# Patient Record
Sex: Female | Born: 1995
Health system: Southern US, Community
[De-identification: ages and names within clinical notes are randomized; demographics above are authoritative.]

## PROBLEM LIST (undated history)

## (undated) DIAGNOSIS — K589 Irritable bowel syndrome without diarrhea: Secondary | ICD-10-CM

## (undated) DIAGNOSIS — D649 Anemia, unspecified: Secondary | ICD-10-CM

## (undated) DIAGNOSIS — J45909 Unspecified asthma, uncomplicated: Secondary | ICD-10-CM

---

## 2019-11-18 ENCOUNTER — Encounter (HOSPITAL_COMMUNITY): Payer: Self-pay | Admitting: Emergency Medicine

## 2019-11-18 ENCOUNTER — Emergency Department (HOSPITAL_COMMUNITY)
Admission: EM | Admit: 2019-11-18 | Discharge: 2019-11-18 | Disposition: A | Discharge status: Home / Self Care | Payer: Self-pay | Attending: Emergency Medicine | Admitting: Emergency Medicine

## 2019-11-18 ENCOUNTER — Emergency Department (HOSPITAL_COMMUNITY): Payer: Self-pay

## 2019-11-18 ENCOUNTER — Other Ambulatory Visit: Payer: Self-pay

## 2019-11-18 DIAGNOSIS — M79641 Pain in right hand: Secondary | ICD-10-CM | POA: Diagnosis not present

## 2019-11-18 DIAGNOSIS — M25531 Pain in right wrist: Secondary | ICD-10-CM | POA: Insufficient documentation

## 2019-11-18 DIAGNOSIS — Z5321 Procedure and treatment not carried out due to patient leaving prior to being seen by health care provider: Secondary | ICD-10-CM | POA: Diagnosis not present

## 2019-11-18 DIAGNOSIS — M542 Cervicalgia: Secondary | ICD-10-CM | POA: Diagnosis present

## 2019-11-18 HISTORY — DX: Unspecified asthma, uncomplicated: J45.909

## 2019-11-18 HISTORY — DX: Anemia, unspecified: D64.9
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
## 2019-11-18 NOTE — ED Notes (Signed)
Pt called x3 in the waiting room. No reponse. Per another pt in the waiting room, pt has left.

## 2019-12-13 MED FILL — MELOXICAM 15 MG TABLET: 15 | 30 days supply | Qty: 30 | Fill #0

## 2020-08-07 ENCOUNTER — Other Ambulatory Visit: Payer: Self-pay

## 2020-08-07 ENCOUNTER — Emergency Department (HOSPITAL_COMMUNITY)
Admission: EM | Admit: 2020-08-07 | Discharge: 2020-08-07 | Disposition: A | Discharge status: Home / Self Care | Payer: No Typology Code available for payment source | Attending: Emergency Medicine | Admitting: Emergency Medicine

## 2020-08-07 ENCOUNTER — Emergency Department (HOSPITAL_COMMUNITY): Payer: No Typology Code available for payment source

## 2020-08-07 ENCOUNTER — Encounter (HOSPITAL_COMMUNITY): Payer: Self-pay | Admitting: Emergency Medicine

## 2020-08-07 DIAGNOSIS — S8251XA Displaced fracture of medial malleolus of right tibia, initial encounter for closed fracture: Secondary | ICD-10-CM | POA: Insufficient documentation

## 2020-08-07 DIAGNOSIS — Y9301 Activity, walking, marching and hiking: Secondary | ICD-10-CM | POA: Insufficient documentation

## 2020-08-07 DIAGNOSIS — J45909 Unspecified asthma, uncomplicated: Secondary | ICD-10-CM | POA: Insufficient documentation

## 2020-08-07 DIAGNOSIS — S99911A Unspecified injury of right ankle, initial encounter: Secondary | ICD-10-CM | POA: Diagnosis present

## 2020-08-07 DIAGNOSIS — W010XXA Fall on same level from slipping, tripping and stumbling without subsequent striking against object, initial encounter: Secondary | ICD-10-CM | POA: Diagnosis not present

## 2020-08-07 DIAGNOSIS — S82891A Other fracture of right lower leg, initial encounter for closed fracture: Secondary | ICD-10-CM

## 2020-08-07 MED ORDER — HYDROCODONE-ACETAMINOPHEN 5-325 MG PO TABS: 1.0000 | ORAL_TABLET | ORAL | 0 refills | Status: AC | PRN

## 2020-08-07 MED ORDER — OXYCODONE-ACETAMINOPHEN 5-325 MG PO TABS
1.0000 | ORAL_TABLET | ORAL | Status: DC | PRN
  Administered 2020-08-07: 1 via ORAL
  Filled 2020-08-07: qty 1

## 2020-08-07 NOTE — ED Triage Notes (Signed)
Patient here for evaluation of right lower leg fracture. [facility] and was told she has a fibula fracture. Patient took 200mg  ibuprofen at 10am this morning. Patient alert and oriented at this time.

## 2020-08-07 NOTE — Discharge Instructions (Signed)
Use the cam walker, whenever you get up to walk.  Try to elevate your right ankle above your heart, as much as possible.  Use ice to the sore area of your right ankle, 3 or 4 times a day for 30 minutes.  Call the on-call orthopedic doctor, or the one you were referred to earlier today, for a follow-up appointment and 3 or 4 days.

## 2020-08-07 NOTE — ED Provider Notes (Signed)
[facility] EMERGENCY DEPARTMENT Provider Note   CSN: [id number] Arrival date & time: 08/07/20  1535     History Chief Complaint  Patient presents with  . Fall    Tracy Lee is a 24 y.o. female.  HPI Patient injured her right ankle today while walking and tripped.  This is described as a mechanical fall.  She injured only her right ankle.  She was seen at student health at [facility] [employer].  She was treated with a ASO brace and referred to orthopedics.  She has not been able to see or contact orthopedics yet.  She was told that she had only a fibula fracture.  No prior injuries to the right leg or ankle.  There are no other known modifying factors.    Past Medical History:  Diagnosis Date  . Anemia   . Asthma     There are no problems to display for this patient.   History reviewed. No pertinent surgical history.   OB History   No obstetric history on file.     No family history on file.  Social History   Tobacco Use  . Smoking status: Not on file  Substance Use Topics  . Alcohol use: Not on file  . Drug use: Not on file    Home Medications Prior to Admission medications   Medication Sig Start Date End Date Taking? Authorizing Provider  HYDROcodone-acetaminophen (NORCO) 5-325 MG tablet Take 1 tablet by mouth every 4 (four) hours as needed. 08/07/20   Mancel Bale, MD    Allergies    Patient has no known allergies.  Review of Systems   Review of Systems  All other systems reviewed and are negative.   Physical Exam Updated Vital Signs BP 119/75 (BP Location: Right Arm)   Pulse 80   Temp 99 F (37.2 C)   Resp 18   Ht 5\' 4"  (1.626 m)   Wt 85.3 kg   LMP 08/01/2020 (Exact Date)   SpO2 100%   BMI 32.27 kg/m   Physical Exam Vitals and nursing note reviewed.  Constitutional:      General: She is not in acute distress.    Appearance: She is well-developed. She is not ill-appearing, toxic-appearing or diaphoretic.  HENT:      Head: Normocephalic and atraumatic.     Right Ear: External ear normal.     Left Ear: External ear normal.  Eyes:     Conjunctiva/sclera: Conjunctivae normal.     Pupils: Pupils are equal, round, and reactive to light.  Neck:     Trachea: Phonation normal.  Cardiovascular:     Rate and Rhythm: Normal rate.  Pulmonary:     Effort: Pulmonary effort is normal.  Abdominal:     General: There is no distension.  Musculoskeletal:     Cervical back: Normal range of motion and neck supple.     Comments: Right ankle tender and swollen, right ankle stable to stress.  No proximal tenderness or deformity of the right leg.  Neurovascular intact distally in the toes of the right foot.  Skin:    General: Skin is warm and dry.  Neurological:     Mental Status: She is alert and oriented to person, place, and time.     Cranial Nerves: No cranial nerve deficit.     Sensory: No sensory deficit.     Motor: No abnormal muscle tone.     Coordination: Coordination normal.  Psychiatric:  Mood and Affect: Mood normal.        Behavior: Behavior normal.        Thought Content: Thought content normal.        Judgment: Judgment normal.     ED Results / Procedures / Treatments   Labs (all labs ordered are listed, but only abnormal results are displayed) Labs Reviewed - No data to display  EKG None  Radiology DG Tibia/Fibula Right  Result Date: 08/07/2020 CLINICAL DATA:  Fall with swelling and pain EXAM: RIGHT TIBIA AND FIBULA - 2 VIEW COMPARISON:  None. FINDINGS: There is a nondisplaced fracture seen through the medial malleolus and distal fibular tip. The ankle mortise however appears to be grossly congruent. There is diffuse soft tissue swelling seen around ankle. IMPRESSION: Nondisplaced fracture seen through the medial malleolus and distal fibular tip. Electronically Signed   By: Jonna Clark M.D.   On: 08/07/2020 16:55   DG Ankle Complete Right  Result Date: 08/07/2020 CLINICAL DATA:   Fall with swelling and pain EXAM: RIGHT ANKLE - COMPLETE 3+ VIEW COMPARISON:  None. FINDINGS: There is a minimally displaced medial malleolar and distal fibular tip fracture. There is minimal widening of the medial clear space measuring 4 mm. Diffuse overlying soft tissue swelling is seen. There is a small ankle joint effusion. IMPRESSION: Minimally displaced medial malleolar and distal fibular tip fracture. Electronically Signed   By: Jonna Clark M.D.   On: 08/07/2020 17:00    Procedures Procedures (including critical care time)  Medications Ordered in ED Medications  oxyCODONE-acetaminophen (PERCOCET/ROXICET) 5-325 MG per tablet 1 tablet (1 tablet Oral Given 08/07/20 1601)    ED Course  I have reviewed the triage vital signs and the nursing notes.  Pertinent labs & imaging results that were available during my care of the patient were reviewed by me and considered in my medical decision making (see chart for details).    MDM Rules/Calculators/A&P                           Patient Vitals for the past 24 hrs:  BP Temp Temp src Pulse Resp SpO2 Height Weight  08/07/20 2226 119/75 99 F (37.2 C) -- 80 18 100 % -- --  08/07/20 2007 124/80 -- -- 79 16 100 % -- --  08/07/20 1545 (!) 132/100 99 F (37.2 C) Oral 97 18 100 % 5\' 4"  (1.626 m) 85.3 kg    11:18 PM Reevaluation with update and discussion. After initial assessment and treatment, an updated evaluation reveals at this time no further complaints.  Findings discussed with the patient all questions were answered.   Medical Decision Making:  This patient is presenting for evaluation of isolated injury to right ankle, which does not require a range of treatment options, and is not a complaint that involves a high risk of morbidity and mortality. The differential diagnoses include sprain, fracture. I decided to review old records, and in summary healthy college student, tripped and injured her right ankle today.  I did not  require additional historical information from anyone.   Radiologic Tests Ordered, included right tib-fib, right ankle.  I independently Visualized: Radiographic images, which show nondisplaced medial and lateral malleoli fractures.    Critical Interventions-clinical evaluation, radiography, observation reassessment  After These Interventions, the Patient was reevaluated and was found stable for discharge.  Stable right ankle fractures, not requiring immediate operative intervention.  Patient treated with cam walker and  discharged with pain medication.  Referred to orthopedics for follow-up care.  She can be semiweightbearing.  She currently has crutches.  CRITICAL CARE-no Performed by: Mancel Bale  Nursing Notes Reviewed/ Care Coordinated Applicable Imaging Reviewed Interpretation of Laboratory Data incorporated into ED treatment  The patient appears reasonably screened and/or stabilized for discharge and I doubt any other medical condition or other [facility] requiring further screening, evaluation, or treatment in the ED at this time prior to discharge.  Plan: Home Medications-OTC analgesia of choice; Home Treatments-CAM Walker for comfort when walking, semiweightbearing; return here if the recommended treatment, does not improve the symptoms; Recommended follow up-orthopedic follow-up as soon as possible.     Final Clinical Impression(s) / ED Diagnoses Final diagnoses:  Closed fracture of right ankle, initial encounter    Rx / DC Orders ED Discharge Orders         Ordered    HYDROcodone-acetaminophen (NORCO) 5-325 MG tablet  Every 4 hours PRN        08/07/20 2246           Mancel Bale, MD 08/07/20 2318

## 2020-08-07 NOTE — Progress Notes (Signed)
Orthopedic Tech Progress Note Patient Details:  Tracy Lee 30-Oct-1995 [id number]  Ortho Devices Type of Ortho Device: CAM walker Ortho Device/Splint Location: rle Ortho Device/Splint Interventions: Ordered, Application, Adjustment   Post Interventions Patient Tolerated: Well Instructions Provided: Care of device, Adjustment of device   [name] 08/07/2020, 11:17 PM

## 2021-02-03 ENCOUNTER — Encounter (HOSPITAL_COMMUNITY): Payer: Self-pay

## 2021-02-03 ENCOUNTER — Emergency Department (HOSPITAL_COMMUNITY)
Admission: EM | Admit: 2021-02-03 | Discharge: 2021-02-03 | Disposition: A | Discharge status: Home / Self Care | Payer: Medicaid - Out of State | Attending: Emergency Medicine | Admitting: Emergency Medicine

## 2021-02-03 ENCOUNTER — Emergency Department (HOSPITAL_COMMUNITY): Payer: Medicaid - Out of State

## 2021-02-03 ENCOUNTER — Other Ambulatory Visit: Payer: Self-pay

## 2021-02-03 DIAGNOSIS — R1084 Generalized abdominal pain: Secondary | ICD-10-CM

## 2021-02-03 DIAGNOSIS — R112 Nausea with vomiting, unspecified: Secondary | ICD-10-CM

## 2021-02-03 DIAGNOSIS — R197 Diarrhea, unspecified: Secondary | ICD-10-CM | POA: Insufficient documentation

## 2021-02-03 HISTORY — DX: Irritable bowel syndrome without diarrhea: K58.9

## 2021-02-03 LAB — CBC WITH DIFFERENTIAL/PLATELET
Abs Immature Granulocytes: 0.04 10*3/uL (ref 0.00–0.07)
Basophils Absolute: 0 10*3/uL (ref 0.0–0.1)
Basophils Relative: 0 %
Eosinophils Absolute: 0 10*3/uL (ref 0.0–0.5)
Eosinophils Relative: 0 %
HCT: 32.3 % — ABNORMAL LOW (ref 36.0–46.0)
Hemoglobin: 9.6 g/dL — ABNORMAL LOW (ref 12.0–15.0)
Immature Granulocytes: 0 %
Lymphocytes Relative: 3 %
Lymphs Abs: 0.3 10*3/uL — ABNORMAL LOW (ref 0.7–4.0)
MCH: 20.6 pg — ABNORMAL LOW (ref 26.0–34.0)
MCHC: 29.7 g/dL — ABNORMAL LOW (ref 30.0–36.0)
MCV: 69.3 fL — ABNORMAL LOW (ref 80.0–100.0)
Monocytes Absolute: 0.5 10*3/uL (ref 0.1–1.0)
Monocytes Relative: 5 %
Neutro Abs: 8.7 10*3/uL — ABNORMAL HIGH (ref 1.7–7.7)
Neutrophils Relative %: 92 %
Platelets: 534 10*3/uL — ABNORMAL HIGH (ref 150–400)
RBC: 4.66 MIL/uL (ref 3.87–5.11)
RDW: 21.5 % — ABNORMAL HIGH (ref 11.5–15.5)
WBC: 9.6 10*3/uL (ref 4.0–10.5)
nRBC: 0 % (ref 0.0–0.2)

## 2021-02-03 LAB — COMPREHENSIVE METABOLIC PANEL
ALT: 16 U/L (ref 0–44)
AST: 26 U/L (ref 15–41)
Albumin: 4.8 g/dL (ref 3.5–5.0)
Alkaline Phosphatase: 37 U/L — ABNORMAL LOW (ref 38–126)
Anion gap: 12 (ref 5–15)
BUN: 16 mg/dL (ref 6–20)
CO2: 18 mmol/L — ABNORMAL LOW (ref 22–32)
Calcium: 9.8 mg/dL (ref 8.9–10.3)
Chloride: 109 mmol/L (ref 98–111)
Creatinine, Ser: 0.54 mg/dL (ref 0.44–1.00)
GFR, Estimated: >60 mL/min (ref >60)
Glucose, Bld: 139 mg/dL — ABNORMAL HIGH (ref 70–99)
Potassium: 3.5 mmol/L (ref 3.5–5.1)
Sodium: 139 mmol/L (ref 135–145)
Total Bilirubin: 1.1 mg/dL (ref 0.3–1.2)
Total Protein: 8.5 g/dL — ABNORMAL HIGH (ref 6.5–8.1)

## 2021-02-03 LAB — URINALYSIS, ROUTINE W REFLEX MICROSCOPIC
Bacteria, UA: NONE SEEN
Bilirubin Urine: NEGATIVE
Glucose, UA: NEGATIVE mg/dL
Ketones, ur: 80 mg/dL — AB
Nitrite: NEGATIVE
Protein, ur: NEGATIVE mg/dL
RBC / HPF: >50 RBC/hpf — ABNORMAL HIGH (ref 0–5)
Specific Gravity, Urine: >1.046 — ABNORMAL HIGH (ref 1.005–1.030)
pH: 5 (ref 5.0–8.0)

## 2021-02-03 LAB — I-STAT BETA HCG BLOOD, ED (MC, WL, AP ONLY): I-stat hCG, quantitative: <5 m[IU]/mL (ref <5)

## 2021-02-03 LAB — LIPASE, BLOOD: Lipase: 26 U/L (ref 11–51)

## 2021-02-03 MED ORDER — HYDROGEN PEROXIDE 3 % EX SOLN
CUTANEOUS | Status: AC
  Filled 2021-02-03: qty 473

## 2021-02-03 MED ORDER — IOHEXOL 300 MG/ML  SOLN
100.0000 mL | Freq: Once | INTRAMUSCULAR | Status: AC | PRN
  Administered 2021-02-03: 100 mL via INTRAVENOUS

## 2021-02-03 MED ORDER — ONDANSETRON 4 MG PO TBDP: 4.0000 mg | ORAL_TABLET | Freq: Three times a day (TID) | ORAL | 0 refills | Status: AC | PRN

## 2021-02-03 MED ORDER — MORPHINE SULFATE (PF) 4 MG/ML IV SOLN
4.0000 mg | Freq: Once | INTRAVENOUS | Status: AC
Start: 2021-02-03 — End: 2021-02-03
  Administered 2021-02-03: 4 mg via INTRAVENOUS
  Filled 2021-02-03: qty 1

## 2021-02-03 MED ORDER — ONDANSETRON HCL 4 MG/2ML IJ SOLN
4.0000 mg | Freq: Once | INTRAMUSCULAR | Status: AC
  Administered 2021-02-03: 4 mg via INTRAVENOUS
  Filled 2021-02-03: qty 2

## 2021-02-03 MED ORDER — MORPHINE SULFATE (PF) 4 MG/ML IV SOLN
4.0000 mg | Freq: Once | INTRAVENOUS | Status: AC
  Administered 2021-02-03: 4 mg via INTRAVENOUS
  Filled 2021-02-03: qty 1

## 2021-02-03 MED ORDER — DICYCLOMINE HCL 20 MG PO TABS: 20.0000 mg | ORAL_TABLET | Freq: Two times a day (BID) | ORAL | 0 refills | Status: AC

## 2021-02-03 MED ORDER — SODIUM CHLORIDE 0.9 % IV BOLUS
1000.0000 mL | Freq: Once | INTRAVENOUS | Status: AC
  Administered 2021-02-03: 1000 mL via INTRAVENOUS

## 2021-02-03 NOTE — ED Notes (Signed)
Pt ate and drank without n/v

## 2021-02-03 NOTE — ED Provider Notes (Signed)
[facility]-EMERGENCY DEPT Provider Note   CSN: [id number] Arrival date & time: 02/03/21  1302     History Chief Complaint  Patient presents with  . Abdominal Pain  . Emesis    Tracy Lee is a 25 y.o. female with history significant for IBS who presents for evaluation nausea, vomiting and diarrhea.  Symptoms began yesterday.  States she has had NBNB emesis every hour.  Denies any melena or bright blood per rectum.  No recent antibiotics or travel.  No sick contacts.  She attempted Pepto-Bismol however had emesis immediately after this.  She denies any prior abdominal surgeries.  Denies any marijuana or illicit substance use.  She denies fever, chills, chest pain, shortness of breath, dysuria, hematuria, paresthesias or weakness.  Denies additional aggravating or alleviating factors.  She denies any alcohol use or chronic NSAID use. Rates pain a 10/10. States pain is "all over." No lower abd pain,pelvic pain. No concern for STD.  History obtained from patient and past medical records.  No interpreter is used  HPI     Past Medical History:  Diagnosis Date  . IBS (irritable bowel syndrome)     There are no problems to display for this patient.   History reviewed. No pertinent surgical history.   OB History   No obstetric history on file.     No family history on file.  Social History   Tobacco Use  . Smoking status: Never Smoker  . Smokeless tobacco: Never Used  Substance Use Topics  . Alcohol use: Not Currently  . Drug use: Not Currently    Home Medications Prior to Admission medications   Medication Sig Start Date End Date Taking? Authorizing Provider  dicyclomine (BENTYL) 20 MG tablet Take 1 tablet (20 mg total) by mouth 2 (two) times daily. 02/03/21  Yes Nakisha Chai A, PA-C  ondansetron (ZOFRAN ODT) 4 MG disintegrating tablet Take 1 tablet (4 mg total) by mouth every 8 (eight) hours as needed for nausea or vomiting. 02/03/21  Yes  Ayahna Solazzo A, PA-C    Allergies    Patient has no known allergies.  Review of Systems   Review of Systems  Constitutional: Negative.   HENT: Negative.   Respiratory: Negative.   Cardiovascular: Negative.   Gastrointestinal: Positive for abdominal pain, diarrhea, nausea and vomiting. Negative for abdominal distention, anal bleeding, blood in stool, constipation and rectal pain.  Genitourinary: Negative.   Musculoskeletal: Negative.   Skin: Negative.   Neurological: Negative.   All other systems reviewed and are negative.   Physical Exam Updated Vital Signs BP (!) 137/91 (BP Location: Left Arm)   Pulse 87   Temp 97.6 F (36.4 C) (Oral)   Resp 18   Ht 5\' 5"  (1.651 m)   Wt 80.7 kg   LMP 02/01/2021   SpO2 99%   BMI 29.62 kg/m   Physical Exam Vitals and nursing note reviewed.  Constitutional:      General: She is not in acute distress.    Appearance: She is well-developed. She is not ill-appearing, toxic-appearing or diaphoretic.     Comments: Tossing and turning in bed.  Actively dry heaving.  HENT:     Head: Normocephalic and atraumatic.     Mouth/Throat:     Mouth: Mucous membranes are moist.  Eyes:     Pupils: Pupils are equal, round, and reactive to light.  Cardiovascular:     Rate and Rhythm: Normal rate.     Heart sounds:  Normal heart sounds.  Pulmonary:     Effort: Pulmonary effort is normal. No respiratory distress.     Breath sounds: Normal breath sounds.  Abdominal:     General: Bowel sounds are normal. There is no distension.     Palpations: Abdomen is soft.     Tenderness: There is generalized abdominal tenderness.     Comments: Soft, diffuse tenderness.  Musculoskeletal:        General: Normal range of motion.     Cervical back: Normal range of motion.  Skin:    General: Skin is warm and dry.     Capillary Refill: Capillary refill takes less than 2 seconds.  Neurological:     General: No focal deficit present.     Mental Status: She is  alert and oriented to person, place, and time.     ED Results / Procedures / Treatments   Labs (all labs ordered are listed, but only abnormal results are displayed) Labs Reviewed  CBC WITH DIFFERENTIAL/PLATELET - Abnormal; Notable for the following components:      Result Value   Hemoglobin 9.6 (*)    HCT 32.3 (*)    MCV 69.3 (*)    MCH 20.6 (*)    MCHC 29.7 (*)    RDW 21.5 (*)    Platelets 534 (*)    Neutro Abs 8.7 (*)    Lymphs Abs 0.3 (*)    All other components within normal limits  COMPREHENSIVE METABOLIC PANEL - Abnormal; Notable for the following components:   CO2 18 (*)    Glucose, Bld 139 (*)    Total Protein 8.5 (*)    Alkaline Phosphatase 37 (*)    All other components within normal limits  LIPASE, BLOOD  URINALYSIS, ROUTINE W REFLEX MICROSCOPIC  I-STAT BETA HCG BLOOD, ED ([facility], [facility], [facility] ONLY)    EKG None  Radiology CT Abdomen Pelvis W Contrast  Result Date: 02/03/2021 CLINICAL DATA:  Generalized abdominal pain with nausea vomiting and diarrhea x1 day EXAM: CT ABDOMEN AND PELVIS WITH CONTRAST TECHNIQUE: Multidetector CT imaging of the abdomen and pelvis was performed using the standard protocol following bolus administration of intravenous contrast. CONTRAST:  OMNIPAQUE IOHEXOL 300 MG/ML  SOLN COMPARISON:  None. FINDINGS: Lower chest: No acute abnormality. Normal size heart. No significant pericardial effusion/thickening. Hepatobiliary: No suspicious hepatic lesion. Gallbladder is grossly unremarkable. No biliary ductal dilation. Pancreas: Within normal limits. Spleen: Within normal limits. Adrenals/Urinary Tract: Adrenal glands are unremarkable. Kidneys are normal, without renal calculi, focal lesion, or hydronephrosis. Bladder is unremarkable. Stomach/Bowel: Stomach is grossly unremarkable. No pathologic dilation of small bowel. Normal appendix. The colon is predominately decompressed limiting evaluation. There is very mild stranding adjacent to a short segment of  transverse colon at the hepatic flexure. Vascular/Lymphatic: No significant vascular findings are present. No enlarged abdominal or pelvic lymph nodes. Reproductive: Intrauterine device which appears to extend through the main component extending through the cervical os and the arms projecting out into the lower uterine segment. No evidence of uterine perforation. Right adnexa is unremarkable. 3.3 cm left ovarian cyst. No follow-up imaging recommended. Note: This recommendation does not apply to premenarchal patients and to those with increased risk (genetic, family history, elevated tumor markers or other high-risk factors) of ovarian cancer. Reference: JACR 2020 Feb; 17(2):248-254 Other: No abdominopelvic ascites.  No pneumoperitoneum. Musculoskeletal: No acute osseous abnormality IMPRESSION: 1. The colon is predominately decompressed with very mild fat stranding adjacent to a short segment of transverse colon at  the hepatic flexure, which may reflect mild colitis. 2. Intrauterine device appears to extend through the cervical os with the arms projecting out into the lower uterine segment. Consistent with malpositioning of the IUD without evidence of uterine perforation. Electronically Signed   By: Maudry Mayhew MD   On: 02/03/2021 16:01   Procedures Procedures   Medications Ordered in ED Medications  sodium chloride 0.9 % bolus 1,000 mL (1,000 mLs Intravenous New Bag/Given 02/03/21 1400)  ondansetron (ZOFRAN) injection 4 mg (4 mg Intravenous Given 02/03/21 1355)  morphine 4 MG/ML injection 4 mg (4 mg Intravenous Given 02/03/21 1356)  iohexol (OMNIPAQUE) 300 MG/ML solution 100 mL (100 mLs Intravenous Contrast Given 02/03/21 1502)   ED Course  I have reviewed the triage vital signs and the nursing notes.  Pertinent labs & imaging results that were available during my care of the patient were reviewed by me and considered in my medical decision making (see chart for details).  25 year old here for  evaluation nausea, vomiting and diarrhea.  Began yesterday. She is afebrile, nonseptic, non-ill-appearing.  Multiple sets of NBNB emesis.  Diarrhea without any melena.  Per rectum.  No recent sick contacts, antibiotics or travel.  On arrival patient is tossing and turning in bed, actively dry heaving.  She denies any marijuana use, illicit substance use.  Heart lungs are clear.  Abdomen is soft however diffusely tender.  She denies any concerns for any pelvic pain, vaginal discharge or STDs.  Plan on labs, imaging and reassess  Labs and imaging personally reviewed and interpreted:  Pregnancy test negative CBC without leukocytosis, hemoglobin 9.6, no prior to compare.  She denies any heavy menstrual cycles or active bleeding CMP CO2 18, glucose 139 Lipase 26 UA pending CT AP likly mild colitis, IUD malposition however no evidence of uterine perf  Patient reassessed. Pain improved. Emesis improved.  Discussed labs and imaging findings.  She would like to attempt p.o. challenge.  Discussed still need to evaluate.  She is agreeable.  She appears significantly more comfortable than her prior evaluations.  On repeat exam patient does not have a surgical abdomin and there are no peritoneal signs.  No indication of appendicitis, bowel obstruction, bowel perforation, cholecystitis, diverticulitis, PID, torsion or ectopic pregnancy.  Patient discharged home with symptomatic treatment and given strict instructions for follow-up with their primary care physician.  I have also discussed reasons to return immediately to the ER.  Patient expresses understanding and agrees with plan.    Patient reassessed. Emesis improved. Tolerated PO challenge.   Care transferred to [facility], [place] who will follow up on UA. Anticipate likely dc home if no continued emesis.      MDM Rules/Calculators/A&P                          Final Clinical Impression(s) / ED Diagnoses Final diagnoses:  Nausea vomiting and diarrhea   Generalized abdominal pain    Rx / DC Orders ED Discharge Orders         Ordered    ondansetron (ZOFRAN ODT) 4 MG disintegrating tablet  Every 8 hours PRN        02/03/21 1812    dicyclomine (BENTYL) 20 MG tablet  2 times daily        02/03/21 1812           Kadasia Kassing A, PA-C 02/03/21 1836    Milagros Loll, MD 02/04/21 [phone]

## 2021-02-03 NOTE — ED Notes (Signed)
Pt aware urine sample is needed 

## 2021-02-03 NOTE — ED Notes (Signed)
Patient is throwing up.

## 2021-02-03 NOTE — ED Notes (Signed)
Pt vomiting again, provider made aware

## 2021-02-03 NOTE — ED Notes (Signed)
Gave pt snack and drink, will reassess shortly

## 2021-02-03 NOTE — ED Notes (Signed)
Patient is drinking gingerale and not vomiting or not feeling nauseas.

## 2021-02-03 NOTE — ED Provider Notes (Signed)
Care assumed at shift change from [facility], PA-C, pending UA and discharge. See their note for full HPI and workup. Briefly, pt presenting with N/V/D abdominal pain. CT AP shows mild signs of colitis. No pelvic complaints.  Physical Exam  BP 105/87   Pulse 86   Temp 97.6 F (36.4 C) (Oral)   Resp 20   Ht 5\' 5"  (1.651 m)   Wt 80.7 kg   LMP 02/01/2021   SpO2 100%   BMI 29.62 kg/m   Physical Exam Vitals and nursing note reviewed.  Constitutional:      Appearance: She is well-developed.  HENT:     Head: Normocephalic and atraumatic.  Eyes:     Conjunctiva/sclera: Conjunctivae normal.  Cardiovascular:     Rate and Rhythm: Normal rate.  Pulmonary:     Effort: Pulmonary effort is normal.  Abdominal:     Comments: vomiting  Neurological:     Mental Status: She is alert.  Psychiatric:        Mood and Affect: Mood normal.        Behavior: Behavior normal.     ED Course/Procedures     Procedures  MDM  U/A is not consistent with UTI, patient also asymptomatic.  Discussed incidental finding on CT of  IUD placement with patient and need for GYN follow-up.  She verbalized understanding of this.  She has vomiting on reevaluation, given an additional dose of antiemetic and patient states she is ready for discharge to home.  Discharged with symptomatic management.      Kavin Weckwerth, 04/03/2021 N, PA-C 02/04/21 1606    04/06/21, MD 02/05/21 2007

## 2021-02-03 NOTE — Discharge Instructions (Addendum)
Your work-up here in the emergency department is reassuring  Your CT scan did show colitis which is likely a viral illness.  You may take Imodium at home  Drink clear liquids until your stomach feels better.  Then you can slowly advance your diet as tolerated with bland foods.  I have also written you for a medication called Bentyl to help with abdominal spasms as well as Zofran which is a disintegrating tablet you may take for any vomiting.  Please follow-up with your gynecologist regarding your IUD placement.  Return for any worsening symptoms

## 2021-02-03 NOTE — ED Notes (Signed)
Brought pt emesis bag

## 2021-02-03 NOTE — ED Triage Notes (Signed)
Pt c/o n/v/d since last night w gen abd pain. states she is vomiting every hour, denies blood in vomit or stool. Hx of GI issues. Pt states she tried pepto at home, but threw it up

## 2021-02-19 IMAGING — CR DG ANKLE COMPLETE 3+V*R*
3 series · 3 of 3 positions shown · non-contrast
Comparison: None.

CLINICAL DATA: Fall with swelling and pain

EXAM:
RIGHT ANKLE - COMPLETE 3+ VIEW

[ankle ap]
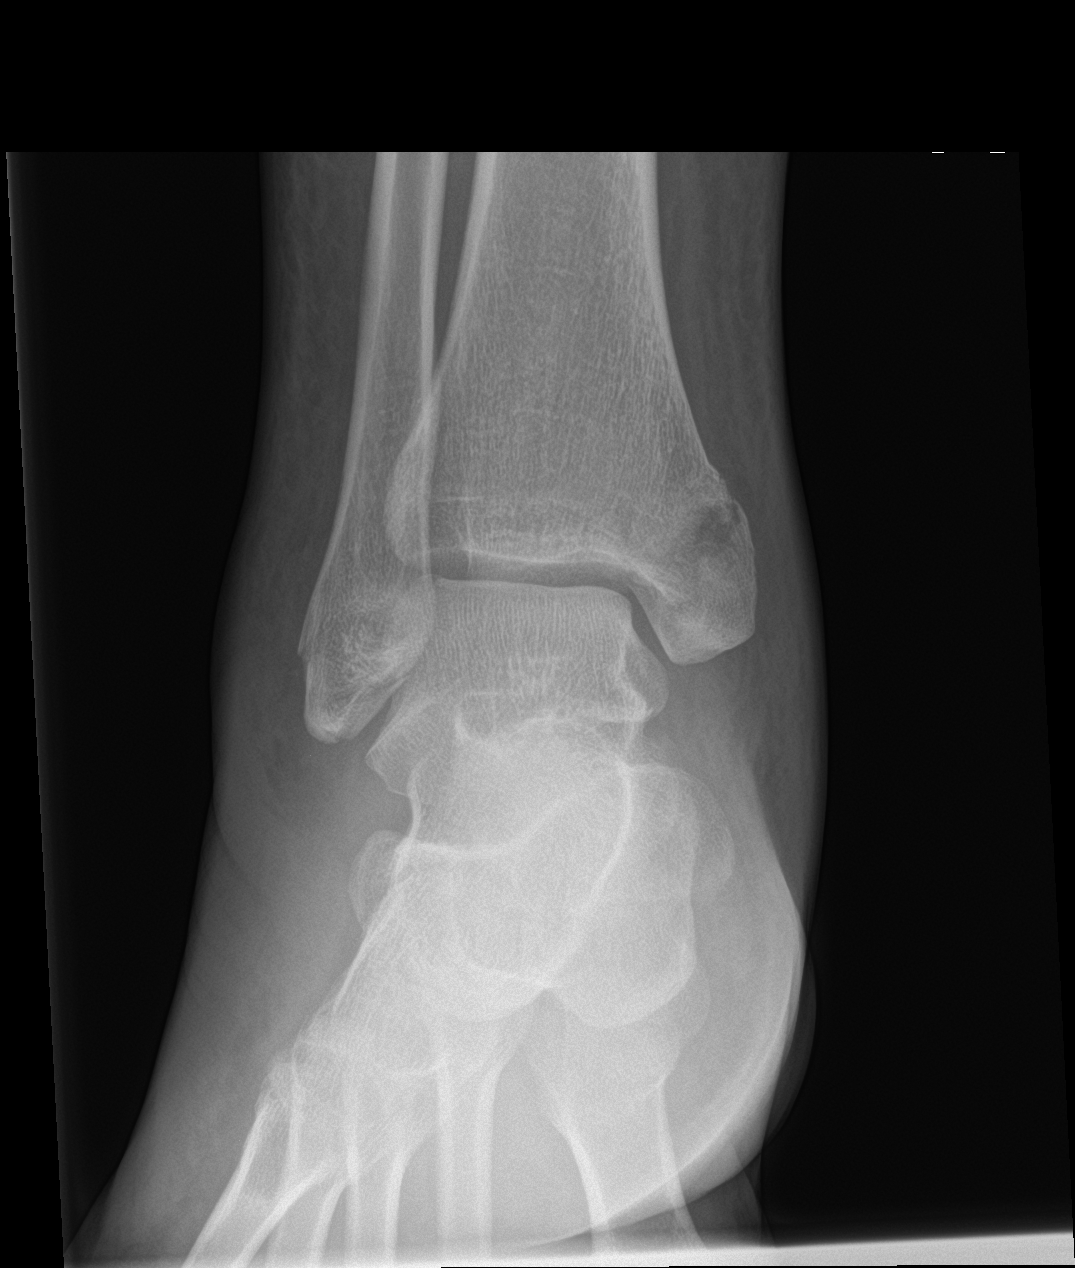

[ankle obl]
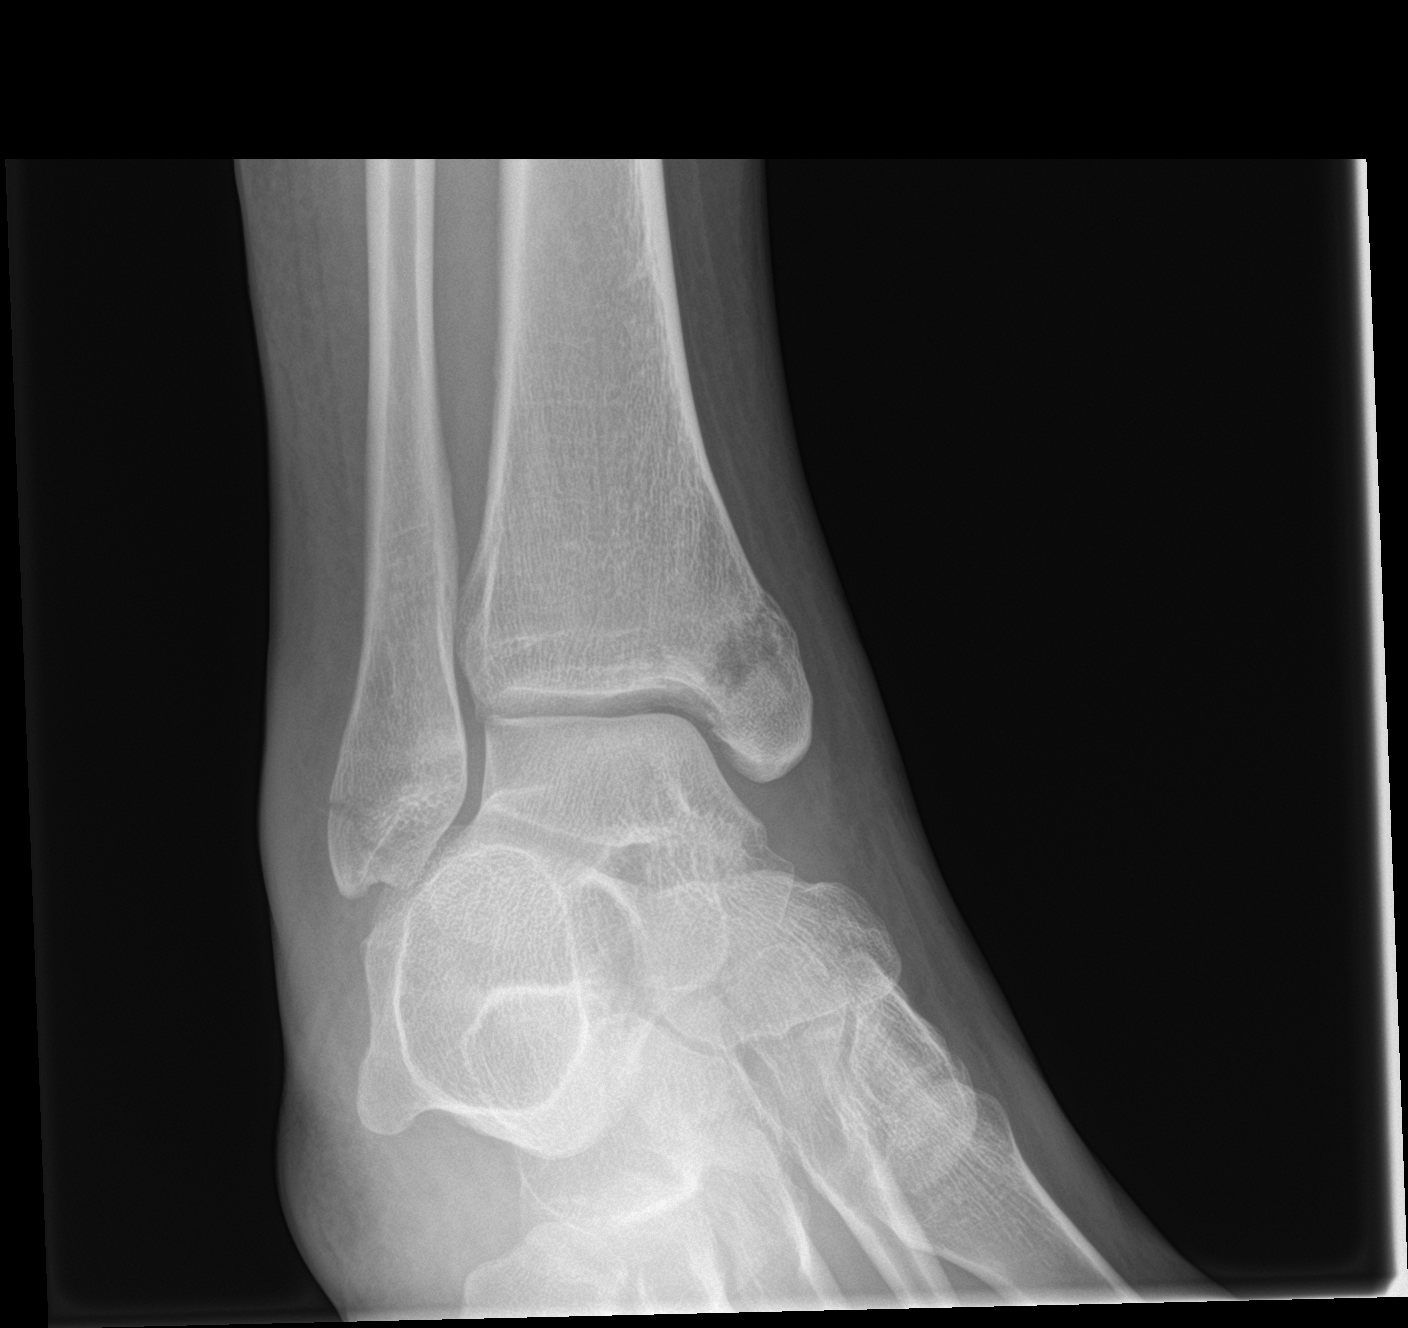

[ankle lat]
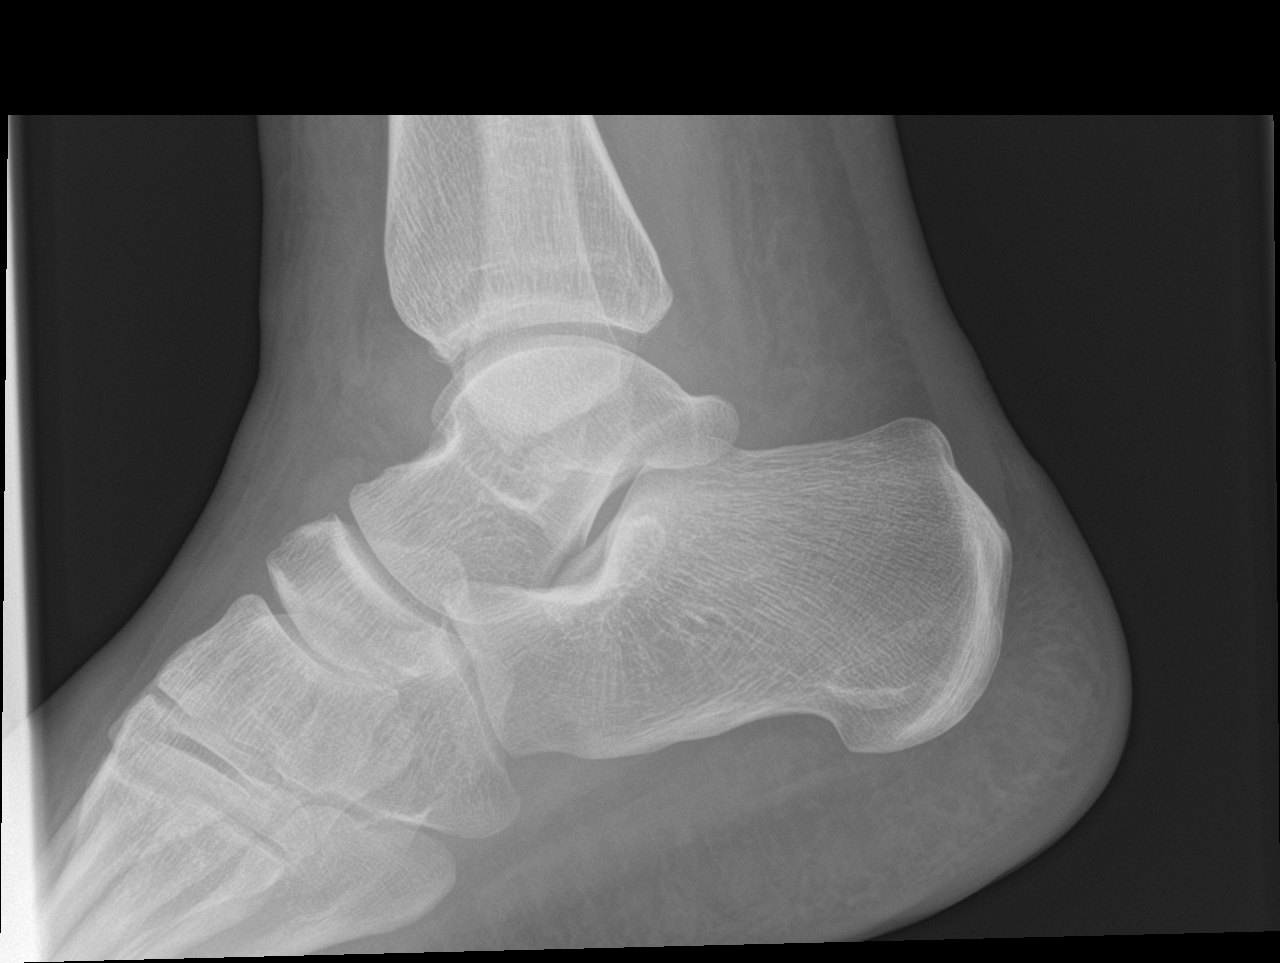

[3 of 3 positions shown; findings below may reference images not displayed]

FINDINGS: There is a minimally displaced medial malleolar and distal fibular
tip fracture. There is minimal widening of the medial clear space
measuring 4 mm. Diffuse overlying soft tissue swelling is seen.
There is a small ankle joint effusion.
IMPRESSION: Minimally displaced medial malleolar and distal fibular tip
fracture.

## 2021-12-14 ENCOUNTER — Other Ambulatory Visit: Payer: Self-pay

## 2021-12-14 ENCOUNTER — Emergency Department (HOSPITAL_COMMUNITY): Payer: Self-pay

## 2021-12-14 ENCOUNTER — Emergency Department (HOSPITAL_COMMUNITY)
Admission: EM | Admit: 2021-12-14 | Discharge: 2021-12-14 | Disposition: A | Discharge status: Home / Self Care | Payer: Self-pay | Attending: Emergency Medicine | Admitting: Emergency Medicine

## 2021-12-14 ENCOUNTER — Encounter (HOSPITAL_COMMUNITY): Payer: Self-pay

## 2021-12-14 DIAGNOSIS — S0592XA Unspecified injury of left eye and orbit, initial encounter: Secondary | ICD-10-CM

## 2021-12-14 DIAGNOSIS — S0512XA Contusion of eyeball and orbital tissues, left eye, initial encounter: Secondary | ICD-10-CM | POA: Insufficient documentation

## 2021-12-14 DIAGNOSIS — R519 Headache, unspecified: Secondary | ICD-10-CM | POA: Insufficient documentation

## 2021-12-14 DIAGNOSIS — J45909 Unspecified asthma, uncomplicated: Secondary | ICD-10-CM | POA: Insufficient documentation

## 2021-12-14 MED ORDER — TETRACAINE HCL 0.5 % OP SOLN
2.0000 [drp] | Freq: Once | OPHTHALMIC | Status: AC
  Administered 2021-12-14: 2 [drp] via OPHTHALMIC
  Filled 2021-12-14: qty 4

## 2021-12-14 MED ORDER — FLUORESCEIN SODIUM 1 MG OP STRP
1.0000 | ORAL_STRIP | Freq: Once | OPHTHALMIC | Status: AC
  Administered 2021-12-14: 1 via OPHTHALMIC
  Filled 2021-12-14: qty 1

## 2021-12-14 MED ORDER — ACETAMINOPHEN 500 MG PO TABS
1000.0000 mg | ORAL_TABLET | Freq: Once | ORAL | Status: AC
  Administered 2021-12-14: 1000 mg via ORAL
  Filled 2021-12-14: qty 2

## 2021-12-14 NOTE — ED Notes (Signed)
Patient transported to CT 
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
